# Patient Record
Sex: Female | Born: 1980 | Race: White | Hispanic: No | Marital: Married | Smoking: Never smoker
Health system: Southern US, Community
[De-identification: ages and names within clinical notes are randomized; demographics above are authoritative.]

## PROBLEM LIST (undated history)

## (undated) DIAGNOSIS — C50919 Malignant neoplasm of unspecified site of unspecified female breast: Secondary | ICD-10-CM

## (undated) DIAGNOSIS — R5383 Other fatigue: Secondary | ICD-10-CM

## (undated) DIAGNOSIS — Z8619 Personal history of other infectious and parasitic diseases: Secondary | ICD-10-CM

## (undated) DIAGNOSIS — R5381 Other malaise: Secondary | ICD-10-CM

## (undated) DIAGNOSIS — C801 Malignant (primary) neoplasm, unspecified: Secondary | ICD-10-CM

## (undated) DIAGNOSIS — D0510 Intraductal carcinoma in situ of unspecified breast: Secondary | ICD-10-CM

## (undated) DIAGNOSIS — Z9010 Acquired absence of unspecified breast and nipple: Secondary | ICD-10-CM

## (undated) DIAGNOSIS — J069 Acute upper respiratory infection, unspecified: Secondary | ICD-10-CM

## (undated) HISTORY — DX: Intraductal carcinoma in situ of unspecified breast: D05.10

## (undated) HISTORY — DX: Acquired absence of unspecified breast and nipple: Z90.10

## (undated) HISTORY — DX: Other malaise: R53.81

## (undated) HISTORY — DX: Other malaise: R53.83

## (undated) HISTORY — PX: BREAST SURGERY: SHX581

## (undated) HISTORY — DX: Acute upper respiratory infection, unspecified: J06.9

## (undated) HISTORY — DX: Malignant neoplasm of unspecified site of unspecified female breast: C50.919

## (undated) HISTORY — DX: Personal history of other infectious and parasitic diseases: Z86.19

---

## 2005-10-04 DIAGNOSIS — C50919 Malignant neoplasm of unspecified site of unspecified female breast: Secondary | ICD-10-CM

## 2005-10-04 HISTORY — DX: Malignant neoplasm of unspecified site of unspecified female breast: C50.919

## 2011-01-20 ENCOUNTER — Other Ambulatory Visit: Payer: Self-pay | Admitting: Oncology

## 2011-01-20 ENCOUNTER — Encounter (HOSPITAL_BASED_OUTPATIENT_CLINIC_OR_DEPARTMENT_OTHER): Payer: BC Managed Care – PPO | Admitting: Oncology

## 2011-01-20 DIAGNOSIS — N6310 Unspecified lump in the right breast, unspecified quadrant: Secondary | ICD-10-CM

## 2011-01-20 DIAGNOSIS — Z9012 Acquired absence of left breast and nipple: Secondary | ICD-10-CM

## 2011-01-20 DIAGNOSIS — D0590 Unspecified type of carcinoma in situ of unspecified breast: Secondary | ICD-10-CM

## 2011-01-20 LAB — CBC WITH DIFFERENTIAL/PLATELET
Basophils Absolute: 0 10*3/uL (ref 0.0–0.1)
Eosinophils Absolute: 0 10*3/uL (ref 0.0–0.5)
HCT: 37.9 % (ref 34.8–46.6)
HGB: 13.1 g/dL (ref 11.6–15.9)
NEUT#: 3.1 10*3/uL (ref 1.5–6.5)
NEUT%: 62.4 % (ref 38.4–76.8)
RDW: 12.6 % (ref 11.2–14.5)
lymph#: 1.5 10*3/uL (ref 0.9–3.3)

## 2011-01-20 LAB — COMPREHENSIVE METABOLIC PANEL
Albumin: 4.4 g/dL (ref 3.5–5.2)
BUN: 13 mg/dL (ref 6–23)
CO2: 30 mEq/L (ref 19–32)
Calcium: 9.4 mg/dL (ref 8.4–10.5)
Chloride: 103 mEq/L (ref 96–112)
Creatinine, Ser: 0.57 mg/dL (ref 0.40–1.20)
Glucose, Bld: 98 mg/dL (ref 70–99)
Potassium: 3.6 mEq/L (ref 3.5–5.3)

## 2011-01-22 ENCOUNTER — Ambulatory Visit
Admission: RE | Admit: 2011-01-22 | Discharge: 2011-01-22 | Disposition: A | Discharge status: Home / Self Care | Payer: BC Managed Care – PPO | Source: Ambulatory Visit | Attending: Oncology | Admitting: Oncology

## 2011-01-22 DIAGNOSIS — N6310 Unspecified lump in the right breast, unspecified quadrant: Secondary | ICD-10-CM

## 2011-01-22 DIAGNOSIS — Z9012 Acquired absence of left breast and nipple: Secondary | ICD-10-CM

## 2011-01-22 HISTORY — DX: Malignant (primary) neoplasm, unspecified: C80.1

## 2011-07-22 ENCOUNTER — Other Ambulatory Visit: Payer: Self-pay | Admitting: Oncology

## 2011-07-22 ENCOUNTER — Encounter (HOSPITAL_BASED_OUTPATIENT_CLINIC_OR_DEPARTMENT_OTHER): Payer: BC Managed Care – PPO | Admitting: Oncology

## 2011-07-22 DIAGNOSIS — D0590 Unspecified type of carcinoma in situ of unspecified breast: Secondary | ICD-10-CM

## 2011-07-22 DIAGNOSIS — Z1231 Encounter for screening mammogram for malignant neoplasm of breast: Secondary | ICD-10-CM

## 2011-07-22 DIAGNOSIS — Z9012 Acquired absence of left breast and nipple: Secondary | ICD-10-CM

## 2011-07-22 LAB — COMPREHENSIVE METABOLIC PANEL
ALT: 11 U/L (ref 0–35)
AST: 13 U/L (ref 0–37)
Albumin: 4.8 g/dL (ref 3.5–5.2)
BUN: 11 mg/dL (ref 6–23)
CO2: 26 mEq/L (ref 19–32)
Calcium: 9.5 mg/dL (ref 8.4–10.5)
Chloride: 100 mEq/L (ref 96–112)
Potassium: 3.8 mEq/L (ref 3.5–5.3)

## 2011-07-22 LAB — CBC WITH DIFFERENTIAL/PLATELET
Basophils Absolute: 0 10*3/uL (ref 0.0–0.1)
EOS%: 0.8 % (ref 0.0–7.0)
Eosinophils Absolute: 0 10*3/uL (ref 0.0–0.5)
HGB: 13.1 g/dL (ref 11.6–15.9)
MCH: 31.3 pg (ref 25.1–34.0)
MONO#: 0.4 10*3/uL (ref 0.1–0.9)
NEUT#: 3.9 10*3/uL (ref 1.5–6.5)
RDW: 12.8 % (ref 11.2–14.5)
WBC: 5.9 10*3/uL (ref 3.9–10.3)
lymph#: 1.5 10*3/uL (ref 0.9–3.3)

## 2011-07-23 ENCOUNTER — Other Ambulatory Visit: Payer: Self-pay | Admitting: Oncology

## 2011-07-23 DIAGNOSIS — Z853 Personal history of malignant neoplasm of breast: Secondary | ICD-10-CM

## 2011-07-23 DIAGNOSIS — Z9012 Acquired absence of left breast and nipple: Secondary | ICD-10-CM

## 2011-08-19 ENCOUNTER — Ambulatory Visit
Admission: RE | Admit: 2011-08-19 | Discharge: 2011-08-19 | Disposition: A | Discharge status: Home / Self Care | Payer: BC Managed Care – PPO | Source: Ambulatory Visit | Attending: Oncology | Admitting: Oncology

## 2011-08-19 DIAGNOSIS — Z9012 Acquired absence of left breast and nipple: Secondary | ICD-10-CM

## 2011-08-19 DIAGNOSIS — Z1231 Encounter for screening mammogram for malignant neoplasm of breast: Secondary | ICD-10-CM

## 2011-09-07 ENCOUNTER — Ambulatory Visit
Admission: RE | Admit: 2011-09-07 | Discharge: 2011-09-07 | Disposition: A | Discharge status: Home / Self Care | Payer: BC Managed Care – PPO | Source: Ambulatory Visit | Attending: Oncology | Admitting: Oncology

## 2011-09-07 DIAGNOSIS — Z9012 Acquired absence of left breast and nipple: Secondary | ICD-10-CM

## 2011-09-07 DIAGNOSIS — Z853 Personal history of malignant neoplasm of breast: Secondary | ICD-10-CM

## 2011-09-07 MED ORDER — GADOBENATE DIMEGLUMINE 529 MG/ML IV SOLN
9.0000 mL | Freq: Once | INTRAVENOUS | Status: AC | PRN
  Administered 2011-09-07: 9 mL via INTRAVENOUS

## 2011-10-05 NOTE — L&D Delivery Note (Signed)
Delivery Note At 7:59 AM a viable and healthy female was delivered via Vaginal, Spontaneous Delivery (Presentation: Left Occiput Anterior).  APGAR: 9, 9; weight 8 lb 11.7 oz (3960 g).   Placenta status: Intact, Spontaneous.  Cord: 3 vessels with the following complications: None.  Cord pH: na  Anesthesia: Epidural  Episiotomy: None Lacerations: 2nd degree Suture Repair: 2.0 vicryl rapide Est. Blood Loss (mL): 400  Mom to postpartum.  Baby to nursery-stable.  Rita Prom J 08/08/2012, 9:52 AM

## 2011-12-28 LAB — OB RESULTS CONSOLE ANTIBODY SCREEN: Antibody Screen: NEGATIVE

## 2011-12-28 LAB — OB RESULTS CONSOLE HIV ANTIBODY (ROUTINE TESTING): HIV: NONREACTIVE

## 2011-12-28 LAB — OB RESULTS CONSOLE GC/CHLAMYDIA: Chlamydia: NEGATIVE

## 2011-12-28 LAB — OB RESULTS CONSOLE RUBELLA ANTIBODY, IGM: Rubella: IMMUNE

## 2011-12-28 LAB — OB RESULTS CONSOLE HEPATITIS B SURFACE ANTIGEN: Hepatitis B Surface Ag: NEGATIVE

## 2012-01-03 ENCOUNTER — Telehealth: Payer: Self-pay | Admitting: Oncology

## 2012-01-03 NOTE — Telephone Encounter (Signed)
lmonvm for pt re appt for 4/18. Schedule mailed.

## 2012-01-10 ENCOUNTER — Telehealth: Payer: Self-pay | Admitting: *Deleted

## 2012-01-10 NOTE — Telephone Encounter (Signed)
patient called in and rescheduled patient appointment to 02-2012 patient confirmed over the phone

## 2012-01-20 ENCOUNTER — Other Ambulatory Visit: Payer: BC Managed Care – PPO | Admitting: Lab

## 2012-01-20 ENCOUNTER — Ambulatory Visit: Payer: BC Managed Care – PPO | Admitting: Oncology

## 2012-02-02 ENCOUNTER — Telehealth: Payer: Self-pay | Admitting: *Deleted

## 2012-02-02 NOTE — Telephone Encounter (Signed)
per  md out of the office moved patient appointment to 03-20-2012 starting at 9:30 left voice message to inform the patient of the new date and time 

## 2012-02-02 NOTE — Telephone Encounter (Signed)
per  md out of the office moved patient appointment to 03-20-2012 starting at 9:30 left voice message to inform the patient of the new date and time

## 2012-02-03 ENCOUNTER — Other Ambulatory Visit: Payer: BC Managed Care – PPO

## 2012-02-08 ENCOUNTER — Other Ambulatory Visit: Payer: BC Managed Care – PPO | Admitting: Lab

## 2012-02-08 ENCOUNTER — Ambulatory Visit: Payer: BC Managed Care – PPO | Admitting: Oncology

## 2012-03-20 ENCOUNTER — Other Ambulatory Visit (HOSPITAL_BASED_OUTPATIENT_CLINIC_OR_DEPARTMENT_OTHER): Payer: BC Managed Care – PPO | Admitting: Lab

## 2012-03-20 ENCOUNTER — Telehealth: Payer: Self-pay | Admitting: *Deleted

## 2012-03-20 ENCOUNTER — Ambulatory Visit (HOSPITAL_BASED_OUTPATIENT_CLINIC_OR_DEPARTMENT_OTHER): Payer: BC Managed Care – PPO | Admitting: Oncology

## 2012-03-20 VITALS — BP 98/62 | HR 108 | Temp 98.3°F | Wt 146.3 lb

## 2012-03-20 DIAGNOSIS — D0590 Unspecified type of carcinoma in situ of unspecified breast: Secondary | ICD-10-CM

## 2012-03-20 DIAGNOSIS — D0510 Intraductal carcinoma in situ of unspecified breast: Secondary | ICD-10-CM

## 2012-03-20 DIAGNOSIS — Z331 Pregnant state, incidental: Secondary | ICD-10-CM

## 2012-03-20 LAB — CBC WITH DIFFERENTIAL/PLATELET
Basophils Absolute: 0 10*3/uL (ref 0.0–0.1)
EOS%: 0.7 % (ref 0.0–7.0)
Eosinophils Absolute: 0 10*3/uL (ref 0.0–0.5)
HGB: 11.5 g/dL — ABNORMAL LOW (ref 11.6–15.9)
NEUT#: 5.3 10*3/uL (ref 1.5–6.5)
RBC: 3.68 10*6/uL — ABNORMAL LOW (ref 3.70–5.45)
RDW: 12.9 % (ref 11.2–14.5)
lymph#: 1.5 10*3/uL (ref 0.9–3.3)

## 2012-03-20 NOTE — Progress Notes (Signed)
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  She had a mammogram and MRI scan back in a November and says he got pregnant in January. Pregnancy is progressing well. She has no complaints. She is taking prenatal vitamin. She is considering further reconstructive surgery on the left breast  Allergies  Allergen Reactions  . Meperidine And Related Hives and Itching    Current Outpatient Prescriptions  Medication Sig Dispense Refill  . Prenatal Multivit-Min-Fe-FA (PRE-NATAL PO) Take by mouth.         Objective:  Filed Vitals:   03/20/12 0927  BP: 98/62  Pulse: 108  Temp: 98.3 F (36.8 C)    BMI: There is no height on file to calculate BMI.   ECOG FS:  Physical Exam:   Sclerae unicteric  Oropharynx clear  No peripheral adenopathy  Lungs clear -- no rales or rhonchi  Heart regular rate and rhythm  Abdomen benign, gravid uterus  MSK no focal spinal tenderness, no peripheral edema  Neuro nonfocal  Breast exam: Right breast normal left implant area looks normal. Both axilla negative.  Lab Results:      Chemistry      Component Value Date/Time   NA 138 07/22/2011 1050   K 3.8 07/22/2011 1050   CL 100 07/22/2011 1050   CO2 26 07/22/2011 1050   BUN 11 07/22/2011 1050   CREATININE 0.60 07/22/2011 1050      Component Value Date/Time   CALCIUM 9.5 07/22/2011 1050   ALKPHOS 37* 07/22/2011 1050   AST 13 07/22/2011 1050   ALT 11 07/22/2011 1050   BILITOT 0.6 07/22/2011 1050       Lab Results  Component Value Date   WBC 7.2 03/20/2012   HGB 11.5* 03/20/2012   HCT 33.9* 03/20/2012   MCV 92.2 03/20/2012   PLT 226 03/20/2012   NEUTROABS 5.3 03/20/2012     Studies/Results:  No results found.  Assessment: A 31 year old woman history of DCIS status post surgery and brief tamoxifen file now pregnant x2  Plan: She is doing well. I will see her in a years time with appropriate imaging studies.        Chasitie Passey 03/20/2012

## 2012-03-20 NOTE — Telephone Encounter (Signed)
Made patient appointment for mammogram at the breast center on 08-21-2012 at 9:00am gave patient appointment for 03-20-2013 starting at 9:00am printed out caelndar and gave to the patient

## 2012-03-21 LAB — COMPREHENSIVE METABOLIC PANEL
AST: 12 U/L (ref 0–37)
Albumin: 3.7 g/dL (ref 3.5–5.2)
BUN: 8 mg/dL (ref 6–23)
Calcium: 8.9 mg/dL (ref 8.4–10.5)
Chloride: 103 mEq/L (ref 96–112)
Glucose, Bld: 99 mg/dL (ref 70–99)
Potassium: 3.9 mEq/L (ref 3.5–5.3)
Sodium: 137 mEq/L (ref 135–145)
Total Protein: 6.1 g/dL (ref 6.0–8.3)

## 2012-03-22 ENCOUNTER — Ambulatory Visit: Payer: BC Managed Care – PPO | Admitting: Oncology

## 2012-03-22 ENCOUNTER — Other Ambulatory Visit: Payer: BC Managed Care – PPO | Admitting: Lab

## 2012-08-02 ENCOUNTER — Inpatient Hospital Stay (HOSPITAL_COMMUNITY): Admission: AD | Admit: 2012-08-02 | Payer: Self-pay | Source: Ambulatory Visit | Admitting: Obstetrics and Gynecology

## 2012-08-04 ENCOUNTER — Encounter (HOSPITAL_COMMUNITY): Payer: Self-pay | Admitting: *Deleted

## 2012-08-04 ENCOUNTER — Telehealth (HOSPITAL_COMMUNITY): Payer: Self-pay | Admitting: *Deleted

## 2012-08-04 NOTE — Telephone Encounter (Signed)
Preadmission screen  

## 2012-08-07 ENCOUNTER — Other Ambulatory Visit: Payer: Self-pay | Admitting: Obstetrics and Gynecology

## 2012-08-08 ENCOUNTER — Encounter (HOSPITAL_COMMUNITY): Payer: Self-pay | Admitting: Anesthesiology

## 2012-08-08 ENCOUNTER — Inpatient Hospital Stay (HOSPITAL_COMMUNITY)
Admission: AD | Admit: 2012-08-08 | Discharge: 2012-08-09 | DRG: 373 | Disposition: A | Discharge status: Home / Self Care | Payer: BC Managed Care – PPO | Source: Ambulatory Visit | Attending: Obstetrics and Gynecology | Admitting: Obstetrics and Gynecology

## 2012-08-08 ENCOUNTER — Encounter (HOSPITAL_COMMUNITY): Payer: Self-pay | Admitting: *Deleted

## 2012-08-08 ENCOUNTER — Inpatient Hospital Stay (HOSPITAL_COMMUNITY): Payer: BC Managed Care – PPO | Admitting: Anesthesiology

## 2012-08-08 ENCOUNTER — Inpatient Hospital Stay (HOSPITAL_COMMUNITY)
Admission: RE | Admit: 2012-08-08 | Payer: BC Managed Care – PPO | Source: Ambulatory Visit | Admitting: Obstetrics and Gynecology

## 2012-08-08 DIAGNOSIS — Z22330 Carrier of Group B streptococcus: Secondary | ICD-10-CM

## 2012-08-08 DIAGNOSIS — O480 Post-term pregnancy: Secondary | ICD-10-CM | POA: Diagnosis present

## 2012-08-08 DIAGNOSIS — Z23 Encounter for immunization: Secondary | ICD-10-CM

## 2012-08-08 DIAGNOSIS — O99892 Other specified diseases and conditions complicating childbirth: Secondary | ICD-10-CM | POA: Diagnosis present

## 2012-08-08 LAB — ABO/RH: ABO/RH(D): O POS

## 2012-08-08 LAB — CBC
HCT: 38.3 % (ref 36.0–46.0)
Hemoglobin: 13 g/dL (ref 12.0–15.0)
MCH: 31 pg (ref 26.0–34.0)
MCHC: 33.9 g/dL (ref 30.0–36.0)

## 2012-08-08 MED ORDER — EPHEDRINE 5 MG/ML INJ
10.0000 mg | INTRAVENOUS | Status: DC | PRN
  Filled 2012-08-08: qty 4

## 2012-08-08 MED ORDER — FENTANYL 2.5 MCG/ML BUPIVACAINE 1/10 % EPIDURAL INFUSION (WH - ANES)
14.0000 mL/h | INTRAMUSCULAR | Status: DC
  Filled 2012-08-08: qty 125

## 2012-08-08 MED ORDER — OXYTOCIN BOLUS FROM INFUSION
500.0000 mL | INTRAVENOUS | Status: DC
  Administered 2012-08-08: 500 mL via INTRAVENOUS

## 2012-08-08 MED ORDER — PRENATAL MULTIVITAMIN CH
1.0000 | ORAL_TABLET | Freq: Every day | ORAL | Status: DC
  Administered 2012-08-08: 1 via ORAL
  Filled 2012-08-08 (×3): qty 1

## 2012-08-08 MED ORDER — FLEET ENEMA 7-19 GM/118ML RE ENEM: 1.0000 | ENEMA | RECTAL | Status: DC | PRN

## 2012-08-08 MED ORDER — FENTANYL 2.5 MCG/ML BUPIVACAINE 1/10 % EPIDURAL INFUSION (WH - ANES)
INTRAMUSCULAR | Status: DC | PRN
  Administered 2012-08-08: 14 mL/h via EPIDURAL

## 2012-08-08 MED ORDER — LANOLIN HYDROUS EX OINT: TOPICAL_OINTMENT | CUTANEOUS | Status: DC | PRN

## 2012-08-08 MED ORDER — METHYLERGONOVINE MALEATE 0.2 MG PO TABS: 0.2000 mg | ORAL_TABLET | ORAL | Status: DC | PRN

## 2012-08-08 MED ORDER — LACTATED RINGERS IV SOLN
INTRAVENOUS | Status: DC
  Administered 2012-08-08: 125 mL/h via INTRAVENOUS

## 2012-08-08 MED ORDER — EPHEDRINE 5 MG/ML INJ: 10.0000 mg | INTRAVENOUS | Status: DC | PRN

## 2012-08-08 MED ORDER — IBUPROFEN 600 MG PO TABS: 600.0000 mg | ORAL_TABLET | Freq: Four times a day (QID) | ORAL | Status: DC | PRN

## 2012-08-08 MED ORDER — SIMETHICONE 80 MG PO CHEW: 80.0000 mg | CHEWABLE_TABLET | ORAL | Status: DC | PRN

## 2012-08-08 MED ORDER — METHYLERGONOVINE MALEATE 0.2 MG/ML IJ SOLN: 0.2000 mg | INTRAMUSCULAR | Status: DC | PRN

## 2012-08-08 MED ORDER — DIBUCAINE 1 % RE OINT: 1.0000 "application " | TOPICAL_OINTMENT | RECTAL | Status: DC | PRN

## 2012-08-08 MED ORDER — WITCH HAZEL-GLYCERIN EX PADS: 1.0000 "application " | MEDICATED_PAD | CUTANEOUS | Status: DC | PRN

## 2012-08-08 MED ORDER — CITRIC ACID-SODIUM CITRATE 334-500 MG/5ML PO SOLN: 30.0000 mL | ORAL | Status: DC | PRN

## 2012-08-08 MED ORDER — PHENYLEPHRINE 40 MCG/ML (10ML) SYRINGE FOR IV PUSH (FOR BLOOD PRESSURE SUPPORT)
80.0000 ug | PREFILLED_SYRINGE | INTRAVENOUS | Status: DC | PRN
  Filled 2012-08-08: qty 5

## 2012-08-08 MED ORDER — DIPHENHYDRAMINE HCL 50 MG/ML IJ SOLN: 12.5000 mg | INTRAMUSCULAR | Status: DC | PRN

## 2012-08-08 MED ORDER — LIDOCAINE HCL (PF) 1 % IJ SOLN
INTRAMUSCULAR | Status: DC | PRN
  Administered 2012-08-08 (×2): 4 mL

## 2012-08-08 MED ORDER — LACTATED RINGERS IV SOLN
500.0000 mL | Freq: Once | INTRAVENOUS | Status: DC
Start: 2012-08-08 — End: 2012-08-08

## 2012-08-08 MED ORDER — LIDOCAINE HCL (PF) 1 % IJ SOLN
30.0000 mL | INTRAMUSCULAR | Status: DC | PRN
  Filled 2012-08-08: qty 30

## 2012-08-08 MED ORDER — BENZOCAINE-MENTHOL 20-0.5 % EX AERO
1.0000 "application " | INHALATION_SPRAY | CUTANEOUS | Status: DC | PRN
  Administered 2012-08-08: 1 via TOPICAL
  Filled 2012-08-08: qty 56

## 2012-08-08 MED ORDER — LACTATED RINGERS IV SOLN: 500.0000 mL | INTRAVENOUS | Status: DC | PRN

## 2012-08-08 MED ORDER — ZOLPIDEM TARTRATE 5 MG PO TABS: 5.0000 mg | ORAL_TABLET | Freq: Every evening | ORAL | Status: DC | PRN

## 2012-08-08 MED ORDER — TETANUS-DIPHTH-ACELL PERTUSSIS 5-2.5-18.5 LF-MCG/0.5 IM SUSP
0.5000 mL | Freq: Once | INTRAMUSCULAR | Status: DC
  Filled 2012-08-08: qty 0.5

## 2012-08-08 MED ORDER — ONDANSETRON HCL 4 MG/2ML IJ SOLN: 4.0000 mg | INTRAMUSCULAR | Status: DC | PRN

## 2012-08-08 MED ORDER — PHENYLEPHRINE 40 MCG/ML (10ML) SYRINGE FOR IV PUSH (FOR BLOOD PRESSURE SUPPORT): 80.0000 ug | PREFILLED_SYRINGE | INTRAVENOUS | Status: DC | PRN

## 2012-08-08 MED ORDER — ONDANSETRON HCL 4 MG/2ML IJ SOLN
4.0000 mg | Freq: Four times a day (QID) | INTRAMUSCULAR | Status: DC | PRN
  Administered 2012-08-08: 4 mg via INTRAVENOUS
  Filled 2012-08-08: qty 2

## 2012-08-08 MED ORDER — DIPHENHYDRAMINE HCL 25 MG PO CAPS: 25.0000 mg | ORAL_CAPSULE | Freq: Four times a day (QID) | ORAL | Status: DC | PRN

## 2012-08-08 MED ORDER — ONDANSETRON HCL 4 MG PO TABS: 4.0000 mg | ORAL_TABLET | ORAL | Status: DC | PRN

## 2012-08-08 MED ORDER — SODIUM CHLORIDE 0.9 % IV SOLN
2.0000 g | Freq: Once | INTRAVENOUS | Status: AC
  Administered 2012-08-08: 2 g via INTRAVENOUS
  Filled 2012-08-08: qty 2000

## 2012-08-08 MED ORDER — SENNOSIDES-DOCUSATE SODIUM 8.6-50 MG PO TABS: 2.0000 | ORAL_TABLET | Freq: Every day | ORAL | Status: DC

## 2012-08-08 MED ORDER — ACETAMINOPHEN 325 MG PO TABS: 650.0000 mg | ORAL_TABLET | ORAL | Status: DC | PRN

## 2012-08-08 MED ORDER — OXYTOCIN 40 UNITS IN LACTATED RINGERS INFUSION - SIMPLE MED
62.5000 mL/h | INTRAVENOUS | Status: DC
  Filled 2012-08-08: qty 1000

## 2012-08-08 MED ORDER — OXYCODONE-ACETAMINOPHEN 5-325 MG PO TABS: 1.0000 | ORAL_TABLET | ORAL | Status: DC | PRN

## 2012-08-08 MED ORDER — IBUPROFEN 600 MG PO TABS
600.0000 mg | ORAL_TABLET | Freq: Four times a day (QID) | ORAL | Status: DC
  Administered 2012-08-08 – 2012-08-09 (×4): 600 mg via ORAL
  Filled 2012-08-08 (×5): qty 1

## 2012-08-08 NOTE — Progress Notes (Signed)
Amanda Suarez is a 31 y.o. G2P1001 at [redacted]w[redacted]d by LMP admitted for active labor  Subjective: Active labor  Objective: BP 123/78  Pulse 90  Temp 97.9 F (36.6 C) (Oral)  Resp 18  Ht 5\' 3"  (1.6 m)  Wt 79.379 kg (175 lb)  BMI 31.00 kg/m2  SpO2 98%  LMP 10/27/2011      FHT:  FHR: 145 bpm, variability: moderate,  accelerations:  Present,  decelerations:  Absent UC:   regular, every 3 minutes SVE:   Dilation: 8 Effacement (%): 100 Station: 0 Exam by:: T.Lessard RN  Labs: Lab Results  Component Value Date   WBC 11.2* 08/08/2012   HGB 13.0 08/08/2012   HCT 38.3 08/08/2012   MCV 91.4 08/08/2012   PLT 209 08/08/2012    Assessment / Plan: Spontaneous labor, progressing normally  Labor: Progressing normally Preeclampsia:  na Fetal Wellbeing:  Category I Pain Control:  Epidural I/D:  n/a Anticipated MOD:  NSVD  Mario Coronado J 08/08/2012, 6:23 AM

## 2012-08-08 NOTE — MAU Note (Signed)
PT SAYS SHE WAS 3 CM IN OFFICE ,    HURT BAD AT 0030.Marland Kitchen    DENIES  HSV AND MRSA.   POSTIVE GBS.

## 2012-08-08 NOTE — Anesthesia Preprocedure Evaluation (Signed)
Anesthesia Evaluation  Patient identified by MRN, date of birth, ID band Patient awake    Reviewed: Allergy & Precautions, H&P , Patient's Chart, lab work & pertinent test results  Airway Mallampati: II TM Distance: >3 FB Neck ROM: full    Dental No notable dental hx. (+) Teeth Intact   Pulmonary neg pulmonary ROS,  breath sounds clear to auscultation  Pulmonary exam normal       Cardiovascular negative cardio ROS  Rhythm:regular Rate:Normal     Neuro/Psych negative neurological ROS  negative psych ROS   GI/Hepatic negative GI ROS, Neg liver ROS,   Endo/Other  negative endocrine ROS  Renal/GU negative Renal ROS  negative genitourinary   Musculoskeletal   Abdominal   Peds  Hematology negative hematology ROS (+)   Anesthesia Other Findings   Reproductive/Obstetrics (+) Pregnancy                           Anesthesia Physical Anesthesia Plan  ASA: II  Anesthesia Plan: Epidural   Post-op Pain Management:    Induction:   Airway Management Planned:   Additional Equipment:   Intra-op Plan:   Post-operative Plan:   Informed Consent: I have reviewed the patients History and Physical, chart, labs and discussed the procedure including the risks, benefits and alternatives for the proposed anesthesia with the patient or authorized representative who has indicated his/her understanding and acceptance.     Plan Discussed with: Anesthesiologist  Anesthesia Plan Comments:         Anesthesia Quick Evaluation

## 2012-08-08 NOTE — Anesthesia Procedure Notes (Signed)
Epidural Patient location during procedure: OB Start time: 08/08/2012 4:13 AM  Staffing Anesthesiologist: Jesusita Jocelyn A. Performed by: anesthesiologist   Preanesthetic Checklist Completed: patient identified, site marked, surgical consent, pre-op evaluation, timeout performed, IV checked, risks and benefits discussed and monitors and equipment checked  Epidural Patient position: sitting Prep: site prepped and draped and DuraPrep Patient monitoring: continuous pulse ox and blood pressure Approach: midline Injection technique: LOR air  Needle:  Needle type: Tuohy  Needle gauge: 17 G Needle length: 9 cm and 9 Needle insertion depth: 5 cm cm Catheter type: closed end flexible Catheter size: 19 Gauge Catheter at skin depth: 10 cm Test dose: negative and Other  Assessment Events: blood not aspirated, injection not painful, no injection resistance, negative IV test and no paresthesia  Additional Notes Patient identified. Risks and benefits discussed including failed block, incomplete  Pain control, post dural puncture headache, nerve damage, paralysis, blood pressure Changes, nausea, vomiting, reactions to medications-both toxic and allergic and post Partum back pain. All questions were answered. Patient expressed understanding and wished to proceed. Sterile technique was used throughout procedure. Epidural site was Dressed with sterile barrier dressing. No paresthesias, signs of intravascular injection Or signs of intrathecal spread were encountered.  Patient was more comfortable after the epidural was dosed. Please see RN's note for documentation of vital signs and FHR which are stable.

## 2012-08-08 NOTE — H&P (Signed)
[name], [place] NO.:  [id number]  MEDICAL RECORD NO.:  [id number]  LOCATION:  9119                          FACILITY:  [facility]  PHYSICIAN:  Lenoard Aden, M.D.DATE OF BIRTH:  Jan 13, 1981  DATE OF ADMISSION:  08/08/2012 DATE OF DISCHARGE:                             HISTORY & PHYSICAL   CHIEF COMPLAINT:  Labor.  She is a 31 year old white female, G2, P1 at 40 weeks and 6 days' gestation, who presents in active labor.  GBS is positive.  ALLERGIES:  DEMEROL.  MEDICATIONS:  Prenatal vitamins and Zyrtec as needed.  SOCIAL HISTORY:  She is a nonsmoker, nondrinker.  Denies domestic or physical violence.  She has a personal history of breast cancer.  FAMILY HISTORY:  Noncontributory.  SURGICAL HISTORY:  Remarkable for mastectomy with left reconstruction. History of vaginal delivery in 2010 of a 8 pound female without complications.  Prenatal course uncomplicated, GBS being positive.  PHYSICAL EXAMINATION:  GENERAL:  She is a well-developed, well-nourished white female, in no acute distress. HEENT:  Normal. NECK:  Supple.  Full range of motion. LUNGS:  Clear. HEART:  Regular rate and rhythm. ABDOMEN:  Soft, gravid, and nontender. PELVIC:  Estimated fetal weight 8.5 pounds.  Cervix pen RN, 5, 80%, vertex, -1. EXTREMITIES:  There are no cords. NEUROLOGIC:  Nonfocal. SKIN:  Intact.  IMPRESSION:  Term intrauterine pregnancy, postdates intrauterine pregnancy in active labor.  PLAN:  Anticipate attempts at vaginal delivery.     Lenoard Aden, M.D.     RJT/MEDQ  D:  08/08/2012  T:  08/08/2012  Job:  [id number]

## 2012-08-08 NOTE — MAU Note (Signed)
Contractions, denies problems with preg.  

## 2012-08-09 ENCOUNTER — Encounter (HOSPITAL_COMMUNITY): Payer: Self-pay

## 2012-08-09 LAB — CBC
HCT: 34.8 % — ABNORMAL LOW (ref 36.0–46.0)
MCHC: 32.8 g/dL (ref 30.0–36.0)
Platelets: 161 10*3/uL (ref 150–400)
RDW: 13.6 % (ref 11.5–15.5)
WBC: 11.7 10*3/uL — ABNORMAL HIGH (ref 4.0–10.5)

## 2012-08-09 MED ORDER — IBUPROFEN 600 MG PO TABS: 600.0000 mg | ORAL_TABLET | Freq: Four times a day (QID) | ORAL | Status: AC | PRN

## 2012-08-09 NOTE — Progress Notes (Signed)
PPD 1 SVD  S:  Reports feeling well, desires early DC             Tolerating po/ No nausea or vomiting             Bleeding is light             Pain controlled with Motrin             Up ad lib / ambulatory / voiding well   Newborn  Information for the patient's newborn:  [place], [place] [[id number]]  female  breast feeding  / Circumcision complete   O:  A & O x 3 NAD             VS:  Filed Vitals:   08/08/12 1130 08/08/12 1520 08/08/12 2348 08/09/12 0645  BP: 112/76 118/73 103/66 99/60  Pulse: 110 94 78 88  Temp: 97.9 F (36.6 C) 97.8 F (36.6 C) 98.5 F (36.9 C) 98.3 F (36.8 C)  TempSrc: Oral Oral Oral Oral  Resp: 18 18 18 18   Height:      Weight:      SpO2:        LABS:  Basename 08/09/12 0745 08/08/12 0340  WBC 11.7* 11.2*  HGB 11.4* 13.0  HCT 34.8* 38.3  PLT 161 209    Blood type: --/--/O POS (11/05 0340)  Rubella: Immune (03/26 0000)   Flu vaccine up to date   I&O: I/O last 3 completed shifts: In: -  Out: 1050 [Urine:250; Blood:800]        Abdomen: soft, non-tender, non-distended              Fundus: firm, non-tender, U -1  Perineum: repair intact  Lochia: small  Extremities: no edema, no calf pain or tenderness, neg Homans    A/P: PPD # 1 31 y.o., [id number]    Active Problems:  NSVD (normal spontaneous vaginal delivery - 11/5)  Postpartum care following vaginal delivery  Second-degree perineal laceration, with delivery TDaP prior to DC  Doing well - stable status  Routine post partum orders  DC home w/ WOB instructions  Matrice Herro, CNM 08/09/2012, 12:19 PM

## 2012-08-09 NOTE — Discharge Summary (Signed)
Obstetric Discharge Summary Reason for Admission: onset of labor Prenatal Procedures: ultrasound Intrapartum Procedures: spontaneous vaginal delivery, GBS prophylaxis and epidural Postpartum Procedures: TDaP Complications-Operative and Postpartum: 2nd degree perineal laceration HGB  Date Value Range Status  03/20/2012 11.5* 11.6 - 15.9 g/dL Final     Hemoglobin  Date Value Range Status  08/09/2012 11.4* 12.0 - 15.0 g/dL Final     HCT  Date Value Range Status  08/09/2012 34.8* 36.0 - 46.0 % Final  03/20/2012 33.9* 34.8 - 46.6 % Final    Physical Exam:  General: alert, cooperative and no distress Lochia: appropriate Uterine Fundus: firm Incision: perineum - healing well DVT Evaluation: No evidence of DVT seen on physical exam.  Discharge Diagnoses: Term Pregnancy-delivered  Discharge Information: Date: 08/09/2012 Activity: pelvic rest Diet: routine Medications: PNV and Ibuprofen Condition: stable Instructions: refer to practice specific booklet Discharge to: home Follow-up Information    Follow up with Lenoard Aden, MD. Schedule an appointment as soon as possible for a visit in 6 weeks.   Contact information:   [address] [place] [address] [phone]          Newborn Data: Live born female Hospital circumcision Birth Weight: 8 lb 11.7 oz (3960 g) APGAR: 9, 9  Home with mother.  Takeem Krotzer, CNM. 08/09/2012, 12:27 PM

## 2012-08-09 NOTE — Progress Notes (Signed)
Lab has attempted to draw blood from patient twice.  She currently does not want her blood drawn and told lab that she will let staff know when she is ready.

## 2012-08-10 NOTE — Anesthesia Postprocedure Evaluation (Signed)
  Anesthesia Post-op Note  Patient: Amanda Suarez  No apparent anesthetic complications.

## 2012-08-21 ENCOUNTER — Ambulatory Visit: Payer: BC Managed Care – PPO

## 2012-09-06 ENCOUNTER — Telehealth: Payer: Self-pay | Admitting: *Deleted

## 2012-09-06 NOTE — Telephone Encounter (Signed)
Mailed out calendar to inform the patient of the new date and time on 03-2013

## 2012-12-23 ENCOUNTER — Encounter: Payer: Self-pay | Admitting: Oncology

## 2012-12-23 ENCOUNTER — Telehealth: Payer: Self-pay | Admitting: *Deleted

## 2012-12-23 NOTE — Telephone Encounter (Signed)
Lm gv appt d/t and the info for Misty Stanley if they had any questions.

## 2013-03-20 ENCOUNTER — Ambulatory Visit: Payer: BC Managed Care – PPO | Admitting: Oncology

## 2013-03-20 ENCOUNTER — Other Ambulatory Visit: Payer: BC Managed Care – PPO | Admitting: Lab

## 2013-03-27 ENCOUNTER — Telehealth: Payer: Self-pay | Admitting: *Deleted

## 2013-03-27 ENCOUNTER — Telehealth: Payer: Self-pay | Admitting: Oncology

## 2013-03-27 NOTE — Telephone Encounter (Signed)
This RN notified by scheduler pt's call cancelling appointment per this office and " does not want to reschedule ".  Reviewed with MD - noted pt is now almost 7 years out from diagnosis with DCIS. Per MD appropriate for pt to be released from care but did request RN call pt to discuss and to call this office if needed.  This RN spoke with per above- note pt was very receptive to call and stated appreciation. Per conversation this RN verified type and date of diagnosis.  At present pt states " I was only coming in 1x a year and now that I have 2 little ones I don't think I need full coverage cancer care."  This RN validated pt's statement as well as stated to call if concerns arise.

## 2013-03-27 NOTE — Telephone Encounter (Signed)
Pt called, cancelled appt with Annice Pih and labs, does notr want to r/s appts, Val, RN notified

## 2013-03-28 ENCOUNTER — Ambulatory Visit: Payer: BC Managed Care – PPO | Admitting: Family

## 2013-03-28 ENCOUNTER — Other Ambulatory Visit: Payer: BC Managed Care – PPO | Admitting: Lab

## 2013-03-28 ENCOUNTER — Ambulatory Visit: Payer: BC Managed Care – PPO | Admitting: Oncology

## 2014-08-05 ENCOUNTER — Encounter (HOSPITAL_COMMUNITY): Payer: Self-pay

## 2014-11-21 ENCOUNTER — Telehealth: Payer: Self-pay | Admitting: *Deleted

## 2014-11-21 NOTE — Telephone Encounter (Signed)
Received referral from Dr. Kennith Maes office. Called and left a message for the pt to return my call so I can schedule her an appt w/ Dr. Jana Hakim.

## 2014-11-25 ENCOUNTER — Telehealth: Payer: Self-pay | Admitting: *Deleted

## 2014-11-25 NOTE — Telephone Encounter (Signed)
Left message for pt to return my call so I can schedule her a Med Onc appt.

## 2014-11-28 ENCOUNTER — Telehealth: Payer: Self-pay | Admitting: *Deleted

## 2014-11-28 NOTE — Telephone Encounter (Signed)
Pt called and left me a message to call her.  Called pt and left her a message to call me today and if she could not reach me today then to call Bary Castilla tomorrow since I will be out of the office.

## 2014-12-09 ENCOUNTER — Telehealth: Payer: Self-pay | Admitting: *Deleted

## 2014-12-09 NOTE — Telephone Encounter (Signed)
Confirmed 01/15/15 appt w/ pt.  Mailed calendar and intake form to pt.  Made copy of records and placed one in Dr. Virgie Dad box and took the other to HIM to scan.

## 2014-12-09 NOTE — Telephone Encounter (Signed)
Left message for pt to return my call so I can schedule a med onc appt w/ her.

## 2015-01-15 ENCOUNTER — Other Ambulatory Visit (HOSPITAL_BASED_OUTPATIENT_CLINIC_OR_DEPARTMENT_OTHER): Payer: BLUE CROSS/BLUE SHIELD

## 2015-01-15 ENCOUNTER — Other Ambulatory Visit: Payer: Self-pay | Admitting: *Deleted

## 2015-01-15 ENCOUNTER — Telehealth: Payer: Self-pay | Admitting: Oncology

## 2015-01-15 ENCOUNTER — Ambulatory Visit (HOSPITAL_BASED_OUTPATIENT_CLINIC_OR_DEPARTMENT_OTHER): Payer: BLUE CROSS/BLUE SHIELD | Admitting: Oncology

## 2015-01-15 VITALS — BP 123/75 | HR 79 | Temp 98.2°F | Resp 18 | Ht 63.0 in | Wt 128.6 lb

## 2015-01-15 DIAGNOSIS — D0512 Intraductal carcinoma in situ of left breast: Secondary | ICD-10-CM | POA: Insufficient documentation

## 2015-01-15 DIAGNOSIS — Z853 Personal history of malignant neoplasm of breast: Secondary | ICD-10-CM

## 2015-01-15 DIAGNOSIS — Z8669 Personal history of other diseases of the nervous system and sense organs: Secondary | ICD-10-CM | POA: Insufficient documentation

## 2015-01-15 DIAGNOSIS — C50912 Malignant neoplasm of unspecified site of left female breast: Secondary | ICD-10-CM

## 2015-01-15 DIAGNOSIS — Z8661 Personal history of infections of the central nervous system: Secondary | ICD-10-CM

## 2015-01-15 LAB — CBC WITH DIFFERENTIAL/PLATELET
BASO%: 1.5 % (ref 0.0–2.0)
Basophils Absolute: 0.1 10*3/uL (ref 0.0–0.1)
EOS%: 4 % (ref 0.0–7.0)
Eosinophils Absolute: 0.2 10*3/uL (ref 0.0–0.5)
HEMATOCRIT: 39.1 % (ref 34.8–46.6)
HEMOGLOBIN: 12.9 g/dL (ref 11.6–15.9)
LYMPH#: 1.4 10*3/uL (ref 0.9–3.3)
LYMPH%: 26.7 % (ref 14.0–49.7)
MCH: 30 pg (ref 25.1–34.0)
MCHC: 33.1 g/dL (ref 31.5–36.0)
MCV: 90.6 fL (ref 79.5–101.0)
MONO#: 0.3 10*3/uL (ref 0.1–0.9)
MONO%: 6.4 % (ref 0.0–14.0)
NEUT%: 61.4 % (ref 38.4–76.8)
NEUTROS ABS: 3.3 10*3/uL (ref 1.5–6.5)
PLATELETS: 301 10*3/uL (ref 145–400)
RBC: 4.32 10*6/uL (ref 3.70–5.45)
RDW: 12.7 % (ref 11.2–14.5)
WBC: 5.4 10*3/uL (ref 3.9–10.3)

## 2015-01-15 LAB — COMPREHENSIVE METABOLIC PANEL (CC13)
ALK PHOS: 44 U/L (ref 40–150)
ALT: 9 U/L (ref 0–55)
AST: 13 U/L (ref 5–34)
Albumin: 4.2 g/dL (ref 3.5–5.0)
Anion Gap: 8 mEq/L (ref 3–11)
BILIRUBIN TOTAL: 0.46 mg/dL (ref 0.20–1.20)
BUN: 12.3 mg/dL (ref 7.0–26.0)
CHLORIDE: 106 meq/L (ref 98–109)
CO2: 27 mEq/L (ref 22–29)
CREATININE: 0.7 mg/dL (ref 0.6–1.1)
Calcium: 8.8 mg/dL (ref 8.4–10.4)
EGFR: >90 mL/min/{1.73_m2} (ref >90)
Glucose: 93 mg/dl (ref 70–140)
Potassium: 4.4 mEq/L (ref 3.5–5.1)
SODIUM: 141 meq/L (ref 136–145)
Total Protein: 7.1 g/dL (ref 6.4–8.3)

## 2015-01-15 NOTE — Telephone Encounter (Signed)
appointemnts made and avs printed for patient °

## 2015-01-15 NOTE — Progress Notes (Signed)
[facility]  Telephone:([phone]) [phone] Fax:(336) [phone]     ID: Amanda Suarez DOB: 09/05/1981  MR#: [id number]  UEK#:[id number]  Patient Care Team: Brien Few, MD as PCP - General (Obstetrics and Gynecology) Chauncey Cruel, MD as Consulting Physician (Oncology) Erline Hau, MD as Consulting Physician (Plastic Surgery) PCP: Lovenia Kim, MD OTHER MD:  CHIEF COMPLAINT: High-risk patient, history of ductal carcinoma in situ  CURRENT TREATMENT: Observation   BREAST CANCER HISTORY: The patient had a left lumpectomy with positive margins followed by left mastectomy and sentinel lymph node sampling in [place]. From her report, it appears she had extensive ductal carcinoma in situ and isolated tumor cells in one of the sentinel lymph nodes. The tumor was estrogen and progesterone receptor positive The surgery was performed at [facility] in [place]. She took tamoxifen for 2 years, but then decided to try for pregnancy and went off anti-estrogens.  She has been tested in the past for the BRCA1 and 2 genes and does not carry a mutation.  INTERVAL HISTORY: Amanda Suarez was evaluated in the high risk clinic 01/15/2015. She is establishing herself in my care today.  REVIEW OF SYSTEMS: She exercises by running and walking. A detailed review of systems today was otherwise entirely negative  PAST MEDICAL HISTORY: Past Medical History  Diagnosis Date  . Cancer   . 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 H/O varicella   . Other malaise and fatigue   . Acute upper respiratory infections of unspecified site   . Ductal carcinoma in situ of breast   . Postpartum care following vaginal delivery 08/09/2012  . NSVD (normal spontaneous vaginal delivery - 11/5) 08/09/2012  . Second-degree perineal laceration, with delivery 08/09/2012    PAST SURGICAL HISTORY: Past Surgical History  Procedure Laterality Date  . Breast surgery      L  mastectomy, reconstruction after mastectomy    FAMILY HISTORY No family history on file. The patient's parents are living, in their early 74s, with no history of cancer as of April 2016. The patient has 2 brothers and 3 sisters. There is no history of breast or ovarian cancer in the family to her knowledge  GYNECOLOGIC HISTORY:  No LMP recorded. Menarche age 48, first live birth age 2 per she is [place] P2. She still having regular periods. For contraception she uses a copper IUD. 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 Her husband Mia Creek is an Programme researcher, broadcasting/film/video with [employer]. Their children are no, 5, and Adam, 2 (ages as of April 2016). The patient is not currently a church attender.   ADVANCED DIRECTIVES: In place   HEALTH MAINTENANCE: History  Substance Use Topics  . Smoking status: Never Smoker   . Smokeless tobacco: Not on file  . Alcohol Use: No     Colonoscopy:  PAP:  Bone density:  Lipid panel:  Allergies  Allergen Reactions  . Meperidine And Related Hives and Itching    Pt states demerol is what caused the allergic reaction    Current Outpatient Prescriptions  Medication Sig Dispense Refill  . cetirizine (ZYRTEC) 10 MG tablet Take 10 mg by mouth daily. For allergies    . ibuprofen (ADVIL,MOTRIN) 600 MG tablet Take 1 tablet (600 mg total) by mouth every 6 (six) hours as needed for pain. 30 tablet  0  . Prenatal Vit-Fe Fumarate-FA (PRENATAL MULTIVITAMIN) TABS Take 1 tablet by mouth daily.     No current facility-administered medications for this visit.    OBJECTIVE: Barbaraann Faster woman who appears well  Filed Vitals:   01/15/15 0914  BP: 123/75  Pulse: 79  Temp: 98.2 F (36.8 C)  Resp: 18     Body mass index is 22.79 kg/(m^2).    ECOG FS:0 - Asymptomatic  Ocular: Sclerae unicteric, pupils equal, round and reactive to light Ear-nose-throat: Oropharynx  clear, dentitionin good repair  Lymphatic: No cervical or supraclavicular adenopathy Lungs no rales or rhonchi, good excursion bilaterally Heart regular rate and rhythm, no murmur appreciated Abd soft, nontender, positive bowel sounds MSK no focal spinal tenderness, no joint edema Neuro: non-focal, well-oriented, appropriate affect Breasts: The right breast is unremarkable. The left breast is status post mastectomy with silicone implant reconstruction. There is no evidence of local recurrence. The left axilla is benign.   LAB RESULTS:  CMP     Component Value Date/Time   NA 141 01/15/2015 0904   NA 137 03/20/2012 0913   K 4.4 01/15/2015 0904   K 3.9 03/20/2012 0913   CL 103 03/20/2012 0913   CO2 27 01/15/2015 0904   CO2 28 03/20/2012 0913   GLUCOSE 93 01/15/2015 0904   GLUCOSE 99 03/20/2012 0913   BUN 12.3 01/15/2015 0904   BUN 8 03/20/2012 0913   CREATININE 0.7 01/15/2015 0904   CREATININE 0.49* 03/20/2012 0913   CALCIUM 8.8 01/15/2015 0904   CALCIUM 8.9 03/20/2012 0913   PROT 7.1 01/15/2015 0904   PROT 6.1 03/20/2012 0913   ALBUMIN 4.2 01/15/2015 0904   ALBUMIN 3.7 03/20/2012 0913   AST 13 01/15/2015 0904   AST 12 03/20/2012 0913   ALT 9 01/15/2015 0904   ALT 11 03/20/2012 0913   ALKPHOS 44 01/15/2015 0904   ALKPHOS 37* 03/20/2012 0913   BILITOT 0.46 01/15/2015 0904   BILITOT 0.3 03/20/2012 0913    INo results found for: SPEP, UPEP  Lab Results  Component Value Date   WBC 5.4 01/15/2015   NEUTROABS 3.3 01/15/2015   HGB 12.9 01/15/2015   HCT 39.1 01/15/2015   MCV 90.6 01/15/2015   PLT 301 01/15/2015      Chemistry      Component Value Date/Time   NA 141 01/15/2015 0904   NA 137 03/20/2012 0913   K 4.4 01/15/2015 0904   K 3.9 03/20/2012 0913   CL 103 03/20/2012 0913   CO2 27 01/15/2015 0904   CO2 28 03/20/2012 0913   BUN 12.3 01/15/2015 0904   BUN 8 03/20/2012 0913   CREATININE 0.7 01/15/2015 0904   CREATININE 0.49* 03/20/2012 0913      Component  Value Date/Time   CALCIUM 8.8 01/15/2015 0904   CALCIUM 8.9 03/20/2012 0913   ALKPHOS 44 01/15/2015 0904   ALKPHOS 37* 03/20/2012 0913   AST 13 01/15/2015 0904   AST 12 03/20/2012 0913   ALT 9 01/15/2015 0904   ALT 11 03/20/2012 0913   BILITOT 0.46 01/15/2015 0904   BILITOT 0.3 03/20/2012 0913       No results found for: LABCA2  No components found for: INOMV672  No results for input(s): INR in the last 168 hours.  Urinalysis No results found for: COLORURINE, APPEARANCEUR, LABSPEC, PHURINE, GLUCOSEU, HGBUR, BILIRUBINUR, KETONESUR, PROTEINUR, UROBILINOGEN, NITRITE, LEUKOCYTESUR  STUDIES: No results found.  ASSESSMENT: 34 y.o. BRCA negative [place] woman status post left mastectomy and sentinel lymph  node sampling in 2007 for what by report was a pTis pN0(i+), stage 0 noninvasive ductal carcinoma, estrogen and progesterone receptor positive, status post adjuvant tamoxifen for 2 years  PLAN: We reviewed her history in detail. Despite the negative family history she remains at high risk of recurrence because of the young age at diagnosis and because her breasts are heterogeneously dense (category C). She warrants yearly MRI in addition to yearly right mammography with tomography. This is being operationalized.  Her genetics testing was performed nearly 10 years ago. A lot has happened since then and currently she would qualify for broader genetic panel. I will schedule her to meet with our genetics counselor.  If she wishes to reduce her risk of breast cancer recurrence she may consider going back on tamoxifen or starting an aromatase inhibitor. We will discuss that at her return visit in 2 months. At that point I expect we will start seeing her on a once a year basis indefinitely.   The patient has a good understanding of the overall plan. She agrees with it. She will call with any problems that may develop before her next visit here.  Chauncey Cruel, MD   01/15/2015 6:37  PM Medical Oncology and Hematology [facility] [address] [place], [place] [address] Tel. [phone]    Fax. [phone]

## 2015-01-22 ENCOUNTER — Ambulatory Visit (HOSPITAL_BASED_OUTPATIENT_CLINIC_OR_DEPARTMENT_OTHER): Payer: BLUE CROSS/BLUE SHIELD | Admitting: Genetic Counselor

## 2015-01-22 ENCOUNTER — Encounter: Payer: Self-pay | Admitting: Genetic Counselor

## 2015-01-22 ENCOUNTER — Other Ambulatory Visit: Payer: BLUE CROSS/BLUE SHIELD

## 2015-01-22 DIAGNOSIS — Z853 Personal history of malignant neoplasm of breast: Secondary | ICD-10-CM

## 2015-01-22 DIAGNOSIS — C50912 Malignant neoplasm of unspecified site of left female breast: Secondary | ICD-10-CM

## 2015-01-22 DIAGNOSIS — Z315 Encounter for genetic counseling: Secondary | ICD-10-CM

## 2015-01-22 NOTE — Progress Notes (Signed)
REFERRING PROVIDER: Brien Few, MD [place], [place] [address]   Amanda Del, MD  PRIMARY PROVIDER:  Lovenia Kim, MD  PRIMARY REASON FOR VISIT:  1. Breast cancer, left      HISTORY OF PRESENT ILLNESS:   Amanda Suarez, a 34 y.o. female, was seen for a [place] cancer genetics consultation at the request of Dr. Jana Hakim due to a personal history of cancer.  [name] presents to clinic today to discuss the possibility of a hereditary predisposition to cancer, genetic testing, and to further clarify her future cancer risks, as well as potential cancer risks for family members.   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  This was found b/c of nipple discharge.  It was treated with a left mastectomy, but no chemotherapy or radiation was needed.  She was on tamoxifen for 2 years and went off to get pregnant.  Amanda Suarez had BRCA testing in 2007 and was reportedly negative.   CANCER HISTORY:   No history exists.     HORMONAL RISK FACTORS:  Menarche was at age 34.  First live birth at age 52.  OCP use for approximately 6-7 years years.  Ovaries intact: yes.  Hysterectomy: no.  Menopausal status: premenopausal.  HRT use: 0 years. Colonoscopy: no; not examined. Mammogram within the last year: patient has been breast feeding for the last 2 years.  She is now planning on getting a mammogram.  In the past she had yearly mammograms and MRIs. Number of breast biopsies: 0. Up to date with pelvic exams:  yes. Any excessive radiation exposure in the past:  no  Past Medical History  Diagnosis Date  . Cancer   . 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 H/O varicella   . Other malaise and fatigue   . Acute upper respiratory infections of unspecified site   . Ductal carcinoma in situ of breast   . Postpartum care following vaginal delivery 08/09/2012  . NSVD (normal spontaneous vaginal delivery - 11/5) 08/09/2012  .  Second-degree perineal laceration, with delivery 08/09/2012  . Breast cancer 2007    DCIS    Past Surgical History  Procedure Laterality Date  . Breast surgery      L mastectomy, reconstruction after mastectomy    History   Social History  . Marital Status: Married    Spouse Name: N/A  . Number of Children: N/A  . Years of Education: N/A   Social History Main Topics  . Smoking status: Never Smoker   . Smokeless tobacco: Not on file  . Alcohol Use: Yes     Comment: red wine almost nightly with dinner  . Drug Use: No  . Sexual Activity: Not on file   Other Topics Concern  . None   Social History Narrative     FAMILY HISTORY:  We obtained a detailed, 4-generation family history.  Significant diagnoses are listed below: Family History  Problem Relation Age of Onset  . Hyperlipidemia Sister 4  . Aneurysm Maternal Grandfather 16    brain  . Dementia Paternal Grandmother    The patient has three sisters and two brothers who are cancer free.  Both parents are alive and have not had cancer. There is no reported cancer in the family other than a maternal great grandfather who died with prostate cancer (rather than from prostate cancer). Patient's maternal ancestors are of [place], [place] and  [place] descent, and paternal ancestors are of [place], [place], [place] and [place] descent. There is no reported Ashkenazi Jewish ancestry. There is no known consanguinity.  GENETIC COUNSELING ASSESSMENT: Amanda Suarez is a 34 y.o. female with a personal history of breast cancer which somewhat suggestive of a hereditary cancer syndrome and predisposition to cancer. We, therefore, discussed and recommended the following at today's visit.   DISCUSSION: We reviewed the characteristics, features and inheritance patterns of hereditary cancer syndromes. We reviewed her reportedly negative BRCA testing (do not have a report) and the updates that have been made since her original test.  Additionally,  she meets criteria for TP53 testing based on her age of onsest of cancer.  We reviewed other hereditary cancer syndromes associated with additional breast/ovairan cancer genes.  We also discussed genetic testing, including the appropriate family members to test, the process of testing, insurance coverage and turn-around-time for results. We discussed the implications of a negative, positive and/or variant of uncertain significant result. We recommended Amanda Suarez pursue genetic testing for the OvaNext gene panel. The OvaNext gene panel offered by [facility] and includes sequencing and rearrangement analysis for the following 24 genes: ATM, BARD1, BRCA1, BRCA2, BRIP1, CDH1, CHEK2, EPCAM, MLH1, MRE11A, MSH2, MSH6, MUTYH, NBN, NF1, PALB2, PMS2, PTEN, RAD50, RAD51C, RAD51D, SMARCA4, STK11, and TP53.   PLAN: After considering the risks, benefits, and limitations, Amanda Suarez  provided informed consent to pursue genetic testing and the blood sample was sent to [facility] for analysis of the [place]. Results should be available within approximately 3 weeks' time, at which point they will be disclosed by telephone to Amanda Suarez, as will any additional recommendations warranted by these results. Amanda Suarez will receive a summary of her genetic counseling visit and a copy of her results once available. This information will also be available in Epic. We encouraged Amanda Suarez to remain in contact with cancer genetics annually so that we can continuously update the family history and inform her of any changes in cancer genetics and testing that may be of benefit for her family. Amanda Suarez questions were answered to her satisfaction today. Our contact information was provided should additional questions or concerns arise.  Lastly, we encouraged Amanda Suarez to remain in contact with cancer genetics annually so that we can continuously update the family history and inform her of any changes in cancer genetics and  testing that may be of benefit for this family.   [name] questions were answered to her satisfaction today. Our contact information was provided should additional questions or concerns arise. Thank you for the referral and allowing [place] to share in the care of your patient.   Amanda Carmen P. Florene Glen, [place], [facility] Certified Genetic Counselor [email or web address] phone: [phone]  The patient was seen for a total of 45 minutes in face-to-face genetic counseling.  This patient was discussed with Drs. Magrinat, Lindi Adie and/or Burr Medico who agrees with the above.    _______________________________________________________________________ For Office Staff:  Number of people involved in session: 1 Was an Intern/ student involved with case: no

## 2015-02-14 ENCOUNTER — Encounter: Payer: Self-pay | Admitting: Genetic Counselor

## 2015-02-14 ENCOUNTER — Telehealth: Payer: Self-pay | Admitting: Genetic Counselor

## 2015-02-14 DIAGNOSIS — Z1379 Encounter for other screening for genetic and chromosomal anomalies: Secondary | ICD-10-CM | POA: Insufficient documentation

## 2015-02-14 NOTE — Telephone Encounter (Signed)
LM on VM with good news message.

## 2015-02-17 ENCOUNTER — Telehealth: Payer: Self-pay | Admitting: Genetic Counselor

## 2015-02-17 ENCOUNTER — Encounter: Payer: Self-pay | Admitting: Genetic Counselor

## 2015-02-17 DIAGNOSIS — D0512 Intraductal carcinoma in situ of left breast: Secondary | ICD-10-CM

## 2015-02-17 DIAGNOSIS — Z1379 Encounter for other screening for genetic and chromosomal anomalies: Secondary | ICD-10-CM

## 2015-02-17 NOTE — Telephone Encounter (Signed)
LM on VM that we had results.  Please call back.

## 2015-02-17 NOTE — Progress Notes (Signed)
HPI: Amanda Suarez was previously seen in the [facility] due to a personal history of cancer and concerns regarding a hereditary predisposition to cancer. Please refer to our prior cancer genetics clinic note for more information regarding Amanda Suarez's medical, social and family histories, and our assessment and recommendations, at the time. Amanda Suarez recent genetic test results were disclosed to her, as were recommendations warranted by these results. These results and recommendations are discussed in more detail below.  GENETIC TEST RESULTS: At the time of Amanda Suarez's visit, we recommended she pursue genetic testing of the OvaNext gene panel. The OvaNext gene panel offered by [facility] and includes sequencing and rearrangement analysis for the following 24 genes: ATM, BARD1, BRCA1, BRCA2, BRIP1, CDH1, CHEK2, EPCAM, MLH1, MRE11A, MSH2, MSH6, MUTYH, NBN, NF1, PALB2, PMS2, PTEN, RAD50, RAD51C, RAD51D, SMARCA4, STK11, and TP53.  The report date is Feb 13, 2015.  Genetic testing was normal, and did not reveal a deleterious mutation in these genes. The test report has been scanned into EPIC and is located under the Media tab.   We discussed with Amanda Suarez that since the current genetic testing is not perfect, it is possible there may be a gene mutation in one of these genes that current testing cannot detect, but that chance is small. We also discussed, that it is possible that another gene that has not yet been discovered, or that we have not yet tested, is responsible for the cancer diagnoses in the family, and it is, therefore, important to remain in touch with cancer genetics in the future so that we can continue to offer Amanda Suarez the most up to date genetic testing.   CANCER SCREENING RECOMMENDATIONS: This result is reassuring and suggests that Amanda Suarez likely does not have an increased risk for a future cancer due to a mutation in one of these genes.  This normal test  also suggests and Amanda Suarez's personal history of cancer was most likely not due to an inherited predisposition associated with one of these genes. Most cancers happen by chance and this negative test suggests that her cancer falls into this category. We, therefore, recommended she continue to follow the cancer management and screening guidelines provided by her oncology and primary providers.   RECOMMENDATIONS FOR FAMILY MEMBERS: Women in this family might be at some increased risk of developing cancer, over the general population risk, simply due to the family history of cancer. We recommended women in this family have a yearly mammogram beginning at age 29, or 36 years younger than the earliest onset of cancer, an an annual clinical breast exam, and perform monthly breast self-exams. Women in this family should also have a gynecological exam as recommended by their primary provider. All family members should have a colonoscopy by age 5.  FOLLOW-UP: Lastly, we discussed with Amanda Suarez that cancer genetics is a rapidly advancing field and it is possible that new genetic tests will be appropriate for her and/or her family members in the future. We encouraged her to remain in contact with cancer genetics on an annual basis so we can update her personal and family histories and let her know of advances in cancer genetics that may benefit this family.   Our contact number was provided. Amanda Suarez questions were answered to her satisfaction, and she knows she is welcome to call us at anytime with additional questions or concerns.   Roma Kayser, MS, [facility] Certified Genetic Counselor [email or web address]

## 2015-02-17 NOTE — Telephone Encounter (Signed)
Revealed negative results on the OvaNext panel test.

## 2015-03-28 ENCOUNTER — Other Ambulatory Visit: Payer: Self-pay | Admitting: Oncology

## 2015-03-28 DIAGNOSIS — Z1231 Encounter for screening mammogram for malignant neoplasm of breast: Secondary | ICD-10-CM

## 2015-04-02 ENCOUNTER — Ambulatory Visit: Payer: BLUE CROSS/BLUE SHIELD | Admitting: Oncology

## 2015-05-20 ENCOUNTER — Ambulatory Visit
Admission: RE | Admit: 2015-05-20 | Discharge: 2015-05-20 | Disposition: A | Discharge status: Home / Self Care | Payer: BLUE CROSS/BLUE SHIELD | Source: Ambulatory Visit | Attending: Oncology | Admitting: Oncology

## 2015-05-20 DIAGNOSIS — Z1231 Encounter for screening mammogram for malignant neoplasm of breast: Secondary | ICD-10-CM

## 2015-05-21 ENCOUNTER — Other Ambulatory Visit: Payer: Self-pay | Admitting: Oncology

## 2015-05-21 DIAGNOSIS — R928 Other abnormal and inconclusive findings on diagnostic imaging of breast: Secondary | ICD-10-CM

## 2015-05-29 ENCOUNTER — Ambulatory Visit
Admission: RE | Admit: 2015-05-29 | Discharge: 2015-05-29 | Disposition: A | Discharge status: Home / Self Care | Payer: BLUE CROSS/BLUE SHIELD | Source: Ambulatory Visit | Attending: Oncology | Admitting: Oncology

## 2015-05-29 DIAGNOSIS — R928 Other abnormal and inconclusive findings on diagnostic imaging of breast: Secondary | ICD-10-CM

## 2015-06-11 ENCOUNTER — Ambulatory Visit
Admission: RE | Admit: 2015-06-11 | Discharge: 2015-06-11 | Disposition: A | Discharge status: Home / Self Care | Payer: BLUE CROSS/BLUE SHIELD | Source: Ambulatory Visit | Attending: Oncology | Admitting: Oncology

## 2015-06-11 DIAGNOSIS — C50912 Malignant neoplasm of unspecified site of left female breast: Secondary | ICD-10-CM

## 2015-06-11 MED ORDER — GADOBENATE DIMEGLUMINE 529 MG/ML IV SOLN
10.0000 mL | Freq: Once | INTRAVENOUS | Status: AC | PRN
  Administered 2015-06-11: 10 mL via INTRAVENOUS

## 2015-06-12 ENCOUNTER — Ambulatory Visit (HOSPITAL_BASED_OUTPATIENT_CLINIC_OR_DEPARTMENT_OTHER): Payer: BLUE CROSS/BLUE SHIELD | Admitting: Oncology

## 2015-06-12 ENCOUNTER — Telehealth: Payer: Self-pay | Admitting: Oncology

## 2015-06-12 VITALS — BP 111/72 | HR 83 | Temp 98.2°F | Resp 18 | Ht 63.0 in | Wt 128.0 lb

## 2015-06-12 DIAGNOSIS — C50919 Malignant neoplasm of unspecified site of unspecified female breast: Secondary | ICD-10-CM

## 2015-06-12 DIAGNOSIS — Z86000 Personal history of in-situ neoplasm of breast: Secondary | ICD-10-CM

## 2015-06-12 DIAGNOSIS — Z1501 Genetic susceptibility to malignant neoplasm of breast: Secondary | ICD-10-CM

## 2015-06-12 NOTE — Progress Notes (Signed)
[place]  [phone] [phone]     ID: Amanda Suarez DOB: [birth year]  MR#: [id number]  KGM#:[id number]  Patient Care Team: Brien Few, MD as PCP - General (Obstetrics and Gynecology) Chauncey Cruel, MD as Consulting Physician (Oncology) Erline Hau, MD as Consulting Physician (Plastic Surgery) PCP: Lovenia Kim, MD OTHER MD:  CHIEF COMPLAINT: High-risk patient, history of ductal carcinoma in situ  CURRENT TREATMENT: Observation   BREAST CANCER HISTORY: From the earlier summary note:  The patient had a left lumpectomy with positive margins followed by left mastectomy and sentinel lymph node sampling in [place]. From her report, it appears she had extensive ductal carcinoma in situ and isolated tumor cells in one of the sentinel lymph nodes. The tumor was estrogen and progesterone receptor positive The surgery was performed at [facility] in [place]. She took tamoxifen for 2 years, but then decided to try for pregnancy and went off anti-estrogens.  She has been tested in the past for the BRCA1 and 2 genes and more recently with a broader panel, all negative.  Her subsequent history is as detailed below.  INTERVAL HISTORY: Amanda Suarez returns today for follow-up of her history of ductal carcinoma in situ and high risk situation. Since her last visit here she had a mammogram, which resulted in a recall but ended up being fine and was followed by an MRI, which shows no evidence of disease. Since her last visit here also she had genetics testing using a 24 Gene panel and that was all negative.   REVIEW OF SYSTEMS: Both her children are now in school so she has more time to herself. She has started a running and gym program. She is having regular periods. A detailed review of systems today was entirely negative.  PAST MEDICAL HISTORY: Past Medical History  Diagnosis Date  . Cancer   . Acquired absence of breast  and nipple     L breast  . H/O varicella   . Other malaise and fatigue   . Acute upper respiratory infections of unspecified site   . Ductal carcinoma in situ of breast   . Postpartum care following vaginal delivery 08/09/2012  . NSVD (normal spontaneous vaginal delivery - 11/5) 08/09/2012  . Second-degree perineal laceration, with delivery 08/09/2012  . Breast cancer 2007    DCIS    PAST SURGICAL HISTORY: Past Surgical History  Procedure Laterality Date  . Breast surgery      L mastectomy, reconstruction after mastectomy    FAMILY HISTORY Family History  Problem Relation Age of Onset  . Hyperlipidemia Sister 53  . Aneurysm Maternal Grandfather 52    brain  . Dementia Paternal Grandmother    The patient's parents are living, in their early 41s, with no history of cancer as of April 2016. The patient has 2 brothers and 3 sisters. There is no history of breast or ovarian cancer in the family to her knowledge  GYNECOLOGIC HISTORY:  Patient's last menstrual period was 05/29/2015. Menarche age 36, first live birth age 88 per she is [place] P2. She still having regular periods. For contraception she uses a copper IUD. 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 Her husband Mia Creek is an Programme researcher, broadcasting/film/video with [employer]. Their children are 6 and 3 (ages as of September 2016).  The patient is not currently a church attender.   ADVANCED DIRECTIVES: In place   HEALTH MAINTENANCE: Social History  Substance Use Topics  . Smoking status: Never Smoker   . Smokeless tobacco: Not on file  . Alcohol Use: Yes     Comment: red wine almost nightly with dinner     Colonoscopy:  PAP:  Bone density:  Lipid panel:  Allergies  Allergen Reactions  . Meperidine And Related Hives and Itching    Pt states demerol is what caused the allergic reaction     Current Outpatient Prescriptions  Medication Sig Dispense Refill  . cetirizine (ZYRTEC) 10 MG tablet Take 10 mg by mouth daily. For allergies    . ibuprofen (ADVIL,MOTRIN) 600 MG tablet Take 1 tablet (600 mg total) by mouth every 6 (six) hours as needed for pain. 30 tablet 0  . Prenatal Vit-Fe Fumarate-FA (PRENATAL MULTIVITAMIN) TABS Take 1 tablet by mouth daily.     No current facility-administered medications for this visit.    OBJECTIVE: Amanda Suarez woman in no acute distress Filed Vitals:   06/12/15 0915  BP: 111/72  Pulse: 83  Temp: 98.2 F (36.8 C)  Resp: 18     Body mass index is 22.68 kg/(m^2).    ECOG FS:0 - Asymptomatic  Sclerae unicteric, pupils round and equal Oropharynx clear and moist-- no thrush or other lesions No cervical or supraclavicular adenopathy Lungs no rales or rhonchi Heart regular rate and rhythm Abd soft, nontender, positive bowel sounds MSK no focal spinal tenderness, no upper extremity lymphedema Neuro: nonfocal, well oriented, appropriate affect Breasts: The right breast is unremarkable. The left breast is status post mastectomy with silicone implant reconstruction. There is no evidence of local recurrence. The left axilla is benign.  Marland Kitchen   LAB RESULTS:  CMP     Component Value Date/Time   NA 141 01/15/2015 0904   NA 137 03/20/2012 0913   K 4.4 01/15/2015 0904   K 3.9 03/20/2012 0913   CL 103 03/20/2012 0913   CO2 27 01/15/2015 0904   CO2 28 03/20/2012 0913   GLUCOSE 93 01/15/2015 0904   GLUCOSE 99 03/20/2012 0913   BUN 12.3 01/15/2015 0904   BUN 8 03/20/2012 0913   CREATININE 0.7 01/15/2015 0904   CREATININE 0.49* 03/20/2012 0913   CALCIUM 8.8 01/15/2015 0904   CALCIUM 8.9 03/20/2012 0913   PROT 7.1 01/15/2015 0904   PROT 6.1 03/20/2012 0913   ALBUMIN 4.2 01/15/2015 0904   ALBUMIN 3.7 03/20/2012 0913   AST 13 01/15/2015 0904   AST 12 03/20/2012 0913   ALT 9 01/15/2015 0904   ALT 11 03/20/2012 0913   ALKPHOS 44 01/15/2015  0904   ALKPHOS 37* 03/20/2012 0913   BILITOT 0.46 01/15/2015 0904   BILITOT 0.3 03/20/2012 0913    INo results found for: SPEP, UPEP  Lab Results  Component Value Date   WBC 5.4 01/15/2015   NEUTROABS 3.3 01/15/2015   HGB 12.9 01/15/2015   HCT 39.1 01/15/2015   MCV 90.6 01/15/2015   PLT 301 01/15/2015      Chemistry      Component Value Date/Time   NA 141 01/15/2015 0904   NA 137 03/20/2012 0913   K 4.4 01/15/2015 0904   K 3.9 03/20/2012 0913   CL 103 03/20/2012 0913   CO2 27 01/15/2015 0904   CO2 28 03/20/2012 0913   BUN 12.3 01/15/2015 0904   BUN 8 03/20/2012 0913   CREATININE 0.7 01/15/2015 0904  CREATININE 0.49* 03/20/2012 0913      Component Value Date/Time   CALCIUM 8.8 01/15/2015 0904   CALCIUM 8.9 03/20/2012 0913   ALKPHOS 44 01/15/2015 0904   ALKPHOS 37* 03/20/2012 0913   AST 13 01/15/2015 0904   AST 12 03/20/2012 0913   ALT 9 01/15/2015 0904   ALT 11 03/20/2012 0913   BILITOT 0.46 01/15/2015 0904   BILITOT 0.3 03/20/2012 0913       No results found for: LABCA2  No components found for: [id number]  No results for input(s): INR in the last 168 hours.  Urinalysis No results found for: COLORURINE, APPEARANCEUR, LABSPEC, PHURINE, GLUCOSEU, HGBUR, BILIRUBINUR, KETONESUR, PROTEINUR, UROBILINOGEN, NITRITE, LEUKOCYTESUR  STUDIES: Mr Breast Bilateral W Wo Contrast  06/11/2015   CLINICAL DATA:  34 year old high risk BRCA negative female with history of high-grade DCIS in the left breast and isolated tumor cells in one of the sentinel lymph nodes, status post left mastectomy in 2007 and subsequent implant reconstruction. She is status post 2 years of tamoxifen therapy, however is not currently on tamoxifen.  LABS:  None  EXAM: BILATERAL BREAST MRI WITH AND WITHOUT CONTRAST  TECHNIQUE: Multiplanar, multisequence MR images of both breasts were obtained prior to and following the intravenous administration of 10 ml of MultiHance.  THREE-DIMENSIONAL MR IMAGE  RENDERING ON INDEPENDENT WORKSTATION:  Three-dimensional MR images were rendered by post-processing of the original MR data on an independent workstation. The three-dimensional MR images were interpreted, and findings are reported in the following complete MRI report for this study. Three dimensional images were evaluated at the independent DynaCad workstation  COMPARISON:  Bilateral breast MRI 09/07/2011 and correlation made with right breast mammogram dated 05/20/2015.  FINDINGS: Breast composition: c. Heterogeneous fibroglandular tissue.  Background parenchymal enhancement: Mild  Right breast: No mass or abnormal enhancement. Specifically, there is no evidence of abnormal enhancement in the retroareolar right breast at the site of questioned distortion on the screening mammogram, which was determined to have resolved on the subsequent diagnostic mammogram.  Left reconstructed breast: Intact left retropectoral implant. No evidence of abnormal enhancement in the reconstructed left breast.  Lymph nodes: No abnormal appearing lymph nodes.  Ancillary findings:  None.  IMPRESSION: 1.  No evidence of malignancy in the bilateral breasts.  RECOMMENDATION: Screening mammogram and MRI in one year for continued high risk screening.  BI-RADS CATEGORY  BI-RADS 2:  Benign   Electronically Signed   By: Ammie Ferrier M.D.   On: 06/11/2015 12:28   Mm Diag Breast Tomo Uni Right  05/29/2015   CLINICAL DATA:  Screening callback for questioned right breast distortion. History of left mastectomy for breast cancer 2007 and age 53.  EXAM: DIGITAL DIAGNOSTIC right MAMMOGRAM WITH 3D TOMOSYNTHESIS AND CAD  COMPARISON:  Previous exam(s).  ACR Breast Density Category c: The breast tissue is heterogeneously dense, which may obscure small masses.  FINDINGS: No persistent distortion or other abnormality is identified in the right breast.  Mammographic images were processed with CAD.  IMPRESSION: No evidence for malignancy in the right  breast.  RECOMMENDATION: Right screening mammogram in 1 year  I have discussed the findings and recommendations with the patient. Results were also provided in writing at the conclusion of the visit. If applicable, a reminder letter will be sent to the patient regarding the next appointment.  BI-RADS CATEGORY  1: Negative.   Electronically Signed   By: Conchita Paris M.D.   On: 05/29/2015 09:46   Mm Screening Breast Tomo Uni  R  05/20/2015   CLINICAL DATA:  Screening.  EXAM: DIGITAL SCREENING UNILATERAL RIGHT MAMMOGRAM WITH TOMO AND CAD  COMPARISON:  08/19/2011.  ACR Breast Density Category c: The breast tissue is heterogeneously dense, which may obscure small masses.  FINDINGS: In the right breast, possible distortion warrants further evaluation. There has been a left mastectomy. Images were processed with CAD.  IMPRESSION: Further evaluation is suggested for possible distortion in the right breast.  RECOMMENDATION: Diagnostic mammogram and possibly ultrasound of the right breast. (Code:[id number])  The patient will be contacted regarding the findings, and additional imaging will be scheduled.  BI-RADS CATEGORY  0: Incomplete. Need additional imaging evaluation and/or prior mammograms for comparison.   Electronically Signed   By: Altamese Cabal M.D.   On: 05/20/2015 11:44    ASSESSMENT: 34 y.o. BRCA negative [place] woman status post left mastectomy and sentinel lymph node sampling in 2007 for what by report was a pTis pN0(i+), stage 0 noninvasive ductal carcinoma, estrogen and progesterone receptor positive  (a) status post adjuvant tamoxifen for 2 years  (1) OvaNext gene panel through [facility] sent 02/13/2015 showed no deleterious mutations in  ATM, BARD1, BRCA1, BRCA2, BRIP1, CDH1, CHEK2, EPCAM, MLH1, MRE11A, MSH2, MSH6, MUTYH, NBN, NF1, PALB2, PMS2, PTEN, RAD50, RAD51C, RAD51D, SMARCA4, STK11, and TP53.   PLAN: Amanda Suarez is doing fine from a breast cancer point of view, with no evidence of  disease recurrence or a new breast cancer developing.  We reviewed the results of her breast MRI which are very favorable. The plan is to continue breast MRIs yearly until she is menopausal and the breast density decreases to category B.  Today we discussed receiving tamoxifen for 4 and additional 3 years. She knows that this would decrease her risk of breast cancer developing almost by half. She tolerated it before with no side effects. At this point however she really would prefer not to take any medication that is not absolutely necessary. She knows she has that option but at this point she would prefer not to exercise.  A good friend recently developed melanoma. She would benefit from yearly screening and I am referring her to [facility] with that in mind.  Otherwise she will return to see me in one year. She knows to call for any problems that may develop before that visit. Chauncey Cruel, MD   06/12/2015 9:27 AM Medical Oncology and Hematology [facility] [address] [place], [place] [address] Tel. [phone]    Fax. [phone]

## 2015-06-12 NOTE — Telephone Encounter (Signed)
Mailed calendar for September 2017.

## 2016-04-26 ENCOUNTER — Telehealth: Payer: Self-pay | Admitting: Internal Medicine

## 2016-04-26 NOTE — Telephone Encounter (Signed)
Called patient to confirm appointment. Patient stated that she has relocated to a different state and that she will no longer use [facility] services. Amanda Suarez.

## 2016-04-30 IMAGING — MR MR BREAST BILATERAL W WO CONTRAST
6 of 13 series · 23 of 48 positions shown · IV contrast (10ml multihance)
Comparison: Bilateral breast MRI 09/07/2011 and correlation made
with right breast mammogram dated 05/20/2015.

CLINICAL DATA: 34-year-old high risk BRCA negative female with
history of high-grade DCIS in the left breast and isolated tumor
cells in one of the sentinel lymph nodes, status post left
mastectomy in 1113 and subsequent implant reconstruction. She is
status post 2 years of tamoxifen therapy, however is not currently
on tamoxifen.

LABS:  None
EXAM:
BILATERAL BREAST MRI WITH AND WITHOUT CONTRAST
TECHNIQUE: Multiplanar, multisequence MR images of both breasts were obtained
prior to and following the intravenous administration of 10 ml of
MultiHance.

[Series 2: T2 · axial · 3.0mm · 0.47mm/px · z∈[-29,+132]mm · 3 of 55 slices shown]
[im 1/55]
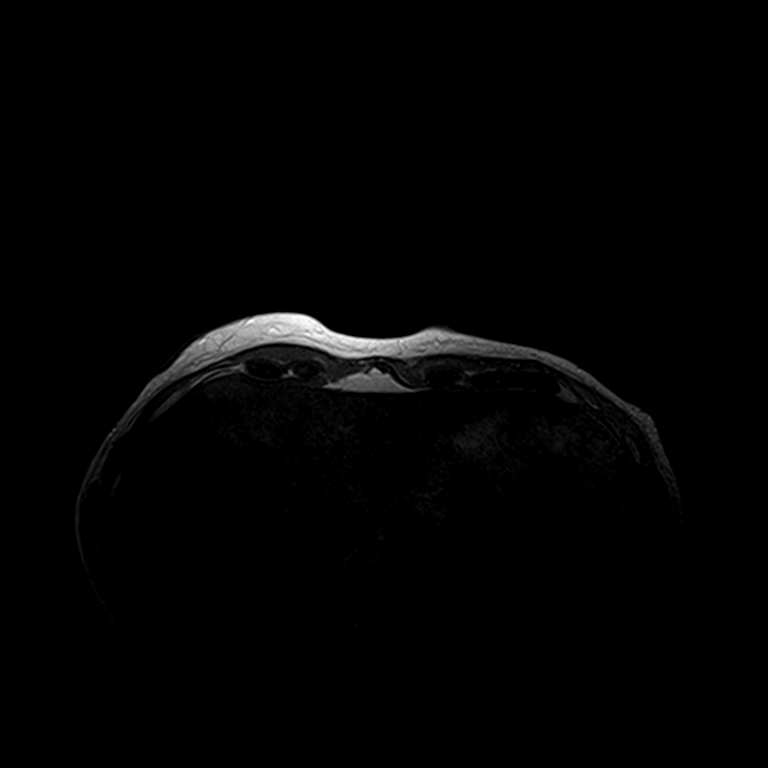
[im 28/55]
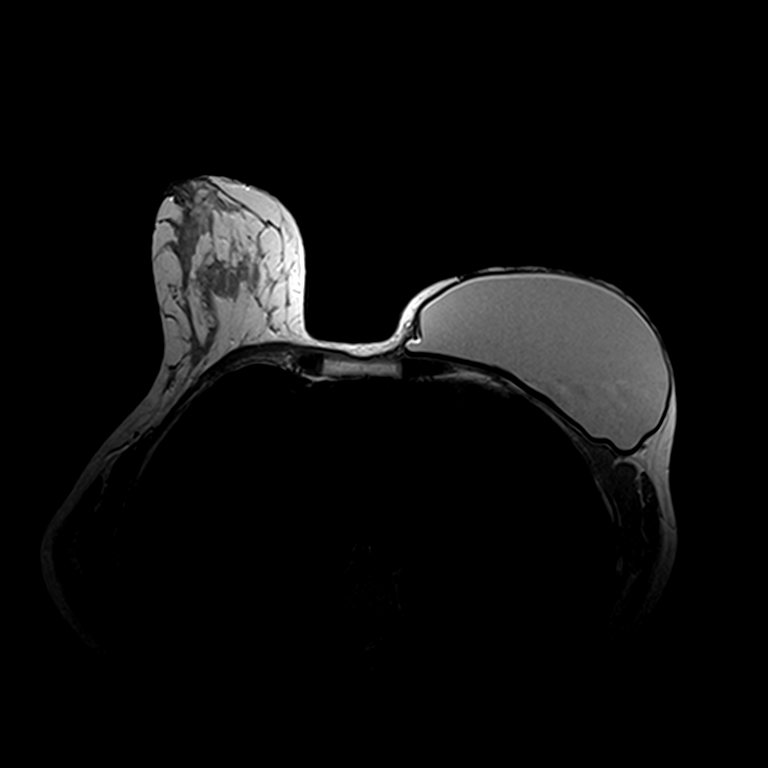
[im 55/55]
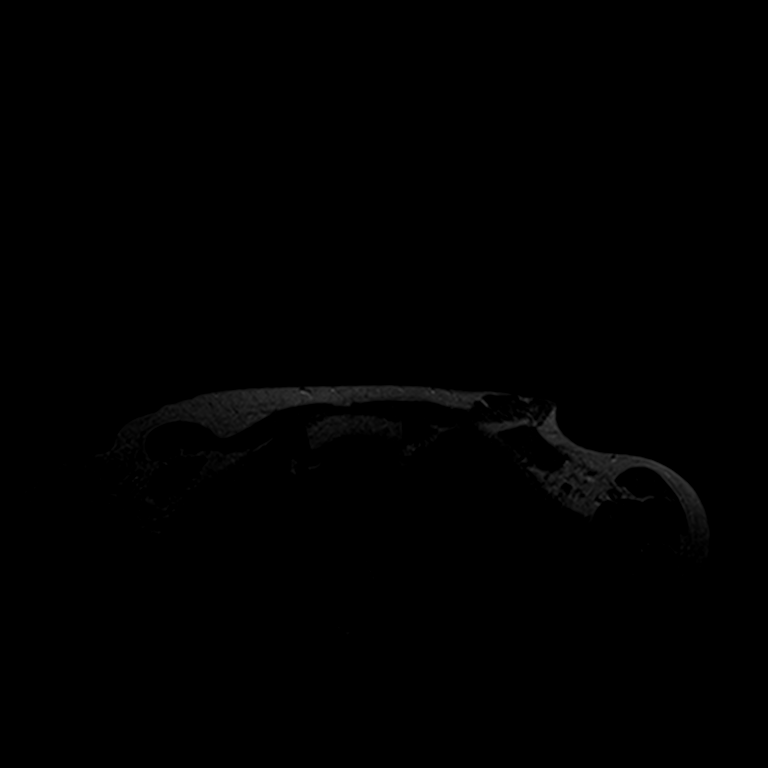

[Series 3: t2_tirm_tra ipat (a-p) · axial · 3.0mm · 0.70mm/px · z∈[-29,+132]mm · 2 of 55 slices shown]
[im 1/55]
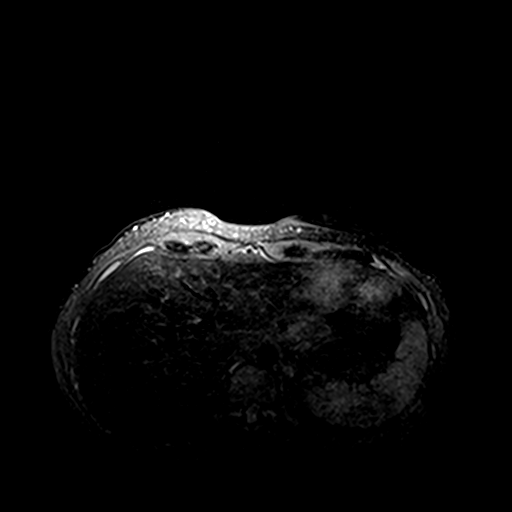
[im 55/55]
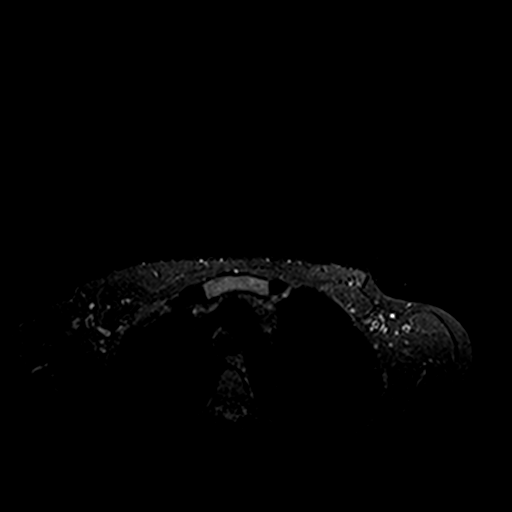

[Series 4: fl3d pre-cm no · axial · non-contrast · 1.2mm · 0.94mm/px · z∈[-34,+136]mm · 5 of 144 slices shown]
[im 1/144]
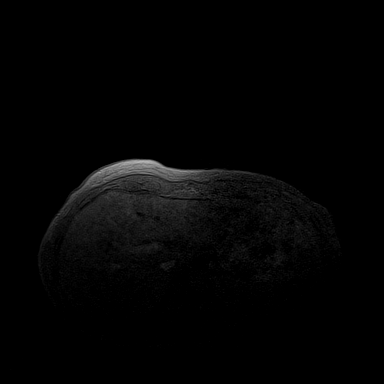
[im 36/144]
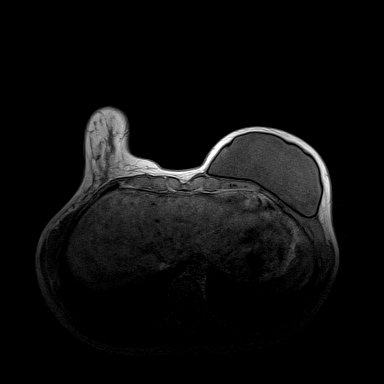
[im 72/144]
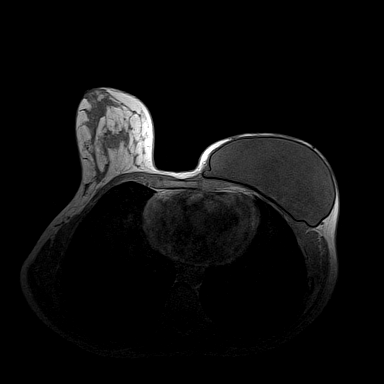
[im 108/144]
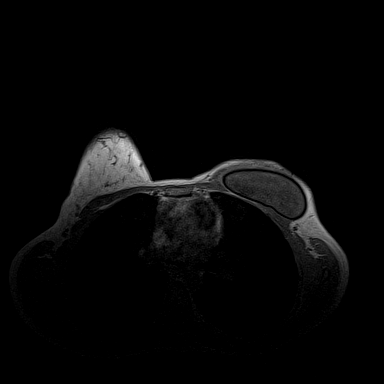
[im 144/144]
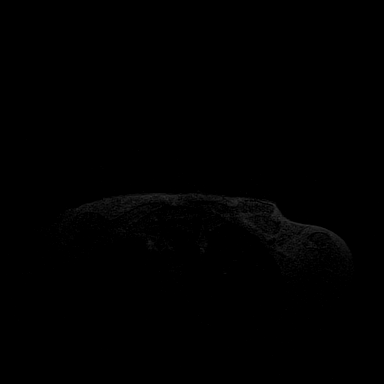

[Series 5: fl3d pre-cm · axial · non-contrast · 1.2mm · 0.94mm/px · z∈[-34,+136]mm · 5 of 144 slices shown]
[im 1/144]
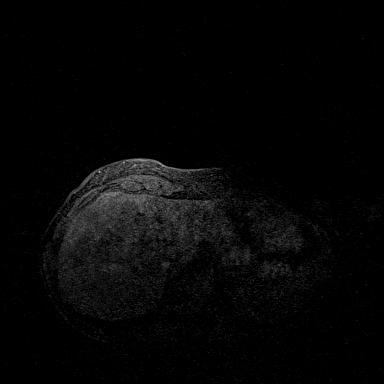
[im 36/144]
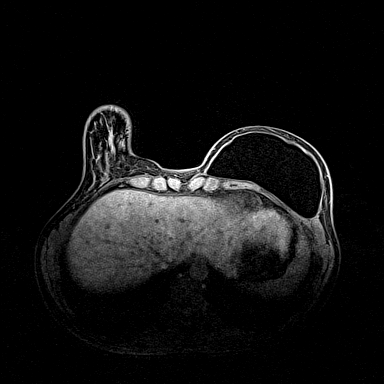
[im 72/144]
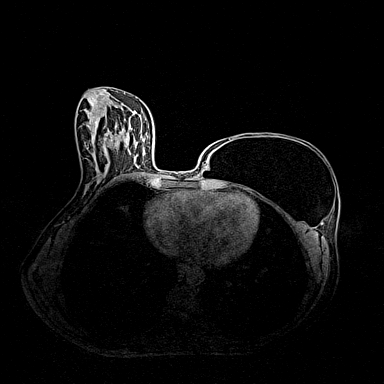
[im 108/144]
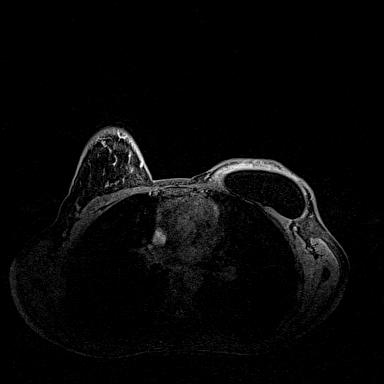
[im 144/144]
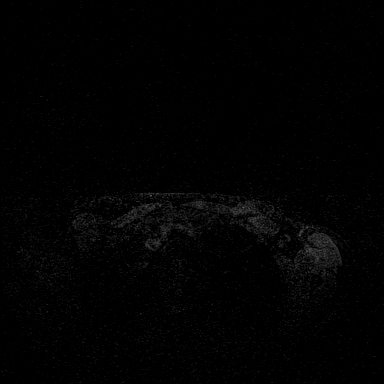

[Series 6: fl3d post immediate · axial · 1.2mm · 0.94mm/px · z∈[-34,+136]mm · 5 of 144 slices shown (1 of 2)]
[im 1/144]
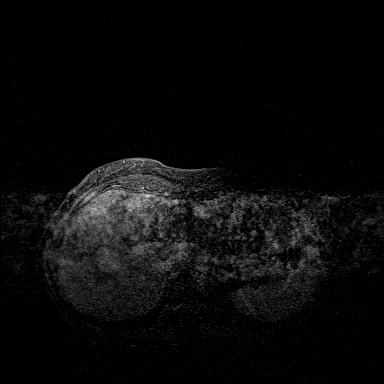
[im 36/144]
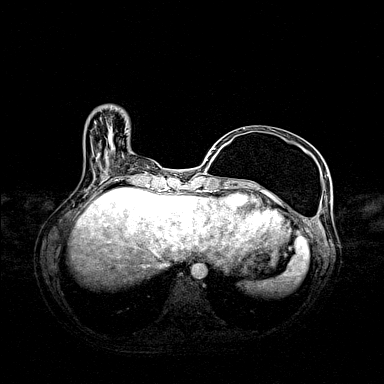
[im 72/144]
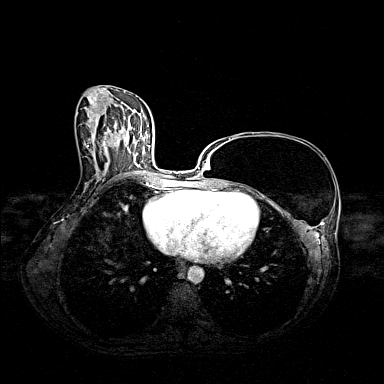
[im 108/144]
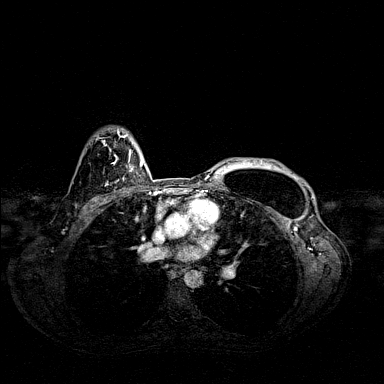
[im 144/144]
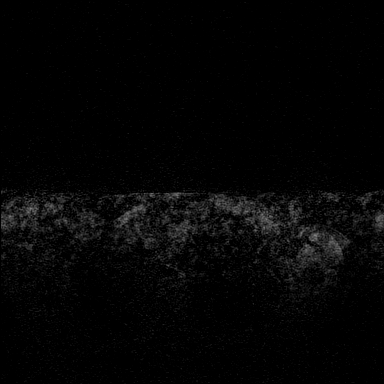

[Series 7: fl3d post immediate · axial · 1.2mm · 0.94mm/px · z∈[-34,+51]mm · 3 of 144 slices shown (2 of 2)]
[im 1/144]
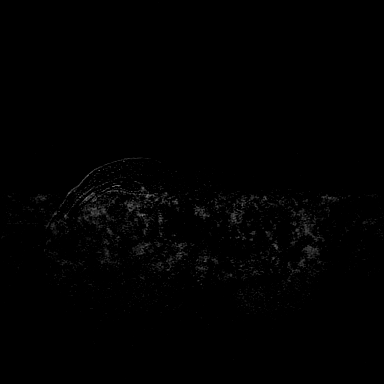
[im 36/144]
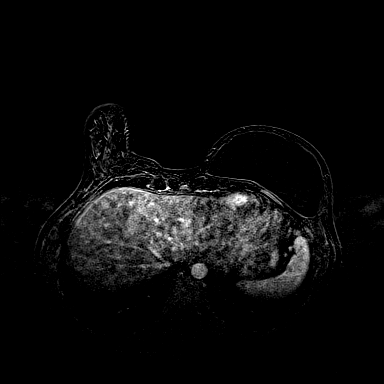
[im 72/144]
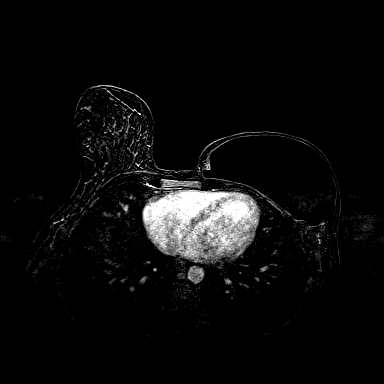

[23 of 48 positions shown; findings below may reference images not displayed]

THREE-DIMENSIONAL MR IMAGE RENDERING ON INDEPENDENT WORKSTATION:

Three-dimensional MR images were rendered by post-processing of the
original MR data on an independent workstation. The
three-dimensional MR images were interpreted, and findings are
reported in the following complete MRI report for this study. Three
dimensional images were evaluated at the independent [employer]
workstation
FINDINGS: Breast composition: c. Heterogeneous fibroglandular tissue.

Background parenchymal enhancement: Mild

Right breast: No mass or abnormal enhancement. Specifically, there
is no evidence of abnormal enhancement in the retroareolar right
breast at the site of questioned distortion on the screening
mammogram, which was determined to have resolved on the subsequent
diagnostic mammogram.

Left reconstructed breast: Intact left retropectoral implant. No
evidence of abnormal enhancement in the reconstructed left breast.

Lymph nodes: No abnormal appearing lymph nodes.

Ancillary findings:  None.
IMPRESSION: 1.  No evidence of malignancy in the bilateral breasts.

RECOMMENDATION:
Screening mammogram and MRI in one year for continued high risk
screening.

BI-RADS CATEGORY  BI-RADS 2:  Benign

## 2016-06-11 ENCOUNTER — Other Ambulatory Visit: Payer: Self-pay

## 2016-06-11 DIAGNOSIS — C50919 Malignant neoplasm of unspecified site of unspecified female breast: Secondary | ICD-10-CM

## 2016-06-24 ENCOUNTER — Other Ambulatory Visit: Payer: BLUE CROSS/BLUE SHIELD

## 2016-07-01 ENCOUNTER — Ambulatory Visit: Payer: BLUE CROSS/BLUE SHIELD | Admitting: Oncology
